# Patient Record
Sex: Male | Born: 1980 | Race: Black or African American | Hispanic: No | Marital: Married | Smoking: Current every day smoker
Health system: Southern US, Community
[De-identification: ages and names within clinical notes are randomized; demographics above are authoritative.]

## PROBLEM LIST (undated history)

## (undated) ENCOUNTER — Emergency Department (HOSPITAL_BASED_OUTPATIENT_CLINIC_OR_DEPARTMENT_OTHER): Admission: EM | Discharge status: Absent without leave | Payer: 59

---

## 2015-05-25 ENCOUNTER — Emergency Department (HOSPITAL_BASED_OUTPATIENT_CLINIC_OR_DEPARTMENT_OTHER)
Admission: EM | Admit: 2015-05-25 | Discharge: 2015-05-26 | Disposition: A | Discharge status: Home / Self Care | Payer: 59 | Attending: Emergency Medicine | Admitting: Emergency Medicine

## 2015-05-25 ENCOUNTER — Encounter (HOSPITAL_BASED_OUTPATIENT_CLINIC_OR_DEPARTMENT_OTHER): Payer: Self-pay | Admitting: Emergency Medicine

## 2015-05-25 ENCOUNTER — Emergency Department (HOSPITAL_BASED_OUTPATIENT_CLINIC_OR_DEPARTMENT_OTHER): Payer: 59

## 2015-05-25 DIAGNOSIS — J209 Acute bronchitis, unspecified: Secondary | ICD-10-CM | POA: Insufficient documentation

## 2015-05-25 DIAGNOSIS — R071 Chest pain on breathing: Secondary | ICD-10-CM | POA: Diagnosis present

## 2015-05-25 DIAGNOSIS — R509 Fever, unspecified: Secondary | ICD-10-CM

## 2015-05-25 DIAGNOSIS — Z720 Tobacco use: Secondary | ICD-10-CM | POA: Diagnosis not present

## 2015-05-25 LAB — COMPREHENSIVE METABOLIC PANEL
ALBUMIN: 4.1 g/dL (ref 3.5–5.0)
ALK PHOS: 38 U/L (ref 38–126)
ALT: 18 U/L (ref 17–63)
AST: 22 U/L (ref 15–41)
Anion gap: 6 (ref 5–15)
BUN: 11 mg/dL (ref 6–20)
CO2: 27 mmol/L (ref 22–32)
Calcium: 8.6 mg/dL — ABNORMAL LOW (ref 8.9–10.3)
Chloride: 100 mmol/L — ABNORMAL LOW (ref 101–111)
Creatinine, Ser: 1.23 mg/dL (ref 0.61–1.24)
GFR calc Af Amer: >60 mL/min (ref >60)
GFR calc non Af Amer: >60 mL/min (ref >60)
GLUCOSE: 101 mg/dL — AB (ref 65–99)
Potassium: 3.8 mmol/L (ref 3.5–5.1)
Sodium: 133 mmol/L — ABNORMAL LOW (ref 135–145)
TOTAL PROTEIN: 7.4 g/dL (ref 6.5–8.1)
Total Bilirubin: 0.7 mg/dL (ref 0.3–1.2)

## 2015-05-25 LAB — CBC WITH DIFFERENTIAL/PLATELET
BASOS ABS: 0 10*3/uL (ref 0.0–0.1)
Basophils Relative: 0 % (ref 0–1)
EOS ABS: 0 10*3/uL (ref 0.0–0.7)
EOS PCT: 0 % (ref 0–5)
HCT: 37.7 % — ABNORMAL LOW (ref 39.0–52.0)
HEMOGLOBIN: 12.7 g/dL — AB (ref 13.0–17.0)
Lymphocytes Relative: 23 % (ref 12–46)
Lymphs Abs: 1.5 10*3/uL (ref 0.7–4.0)
MCH: 30.9 pg (ref 26.0–34.0)
MCHC: 33.7 g/dL (ref 30.0–36.0)
MCV: 91.7 fL (ref 78.0–100.0)
Monocytes Absolute: 1 10*3/uL (ref 0.1–1.0)
Monocytes Relative: 15 % — ABNORMAL HIGH (ref 3–12)
NEUTROS ABS: 4 10*3/uL (ref 1.7–7.7)
Neutrophils Relative %: 62 % (ref 43–77)
PLATELETS: 143 10*3/uL — AB (ref 150–400)
RBC: 4.11 MIL/uL — ABNORMAL LOW (ref 4.22–5.81)
RDW: 11.3 % — AB (ref 11.5–15.5)
WBC: 6.5 10*3/uL (ref 4.0–10.5)

## 2015-05-25 LAB — URINALYSIS, ROUTINE W REFLEX MICROSCOPIC
Bilirubin Urine: NEGATIVE
Glucose, UA: NEGATIVE mg/dL
HGB URINE DIPSTICK: NEGATIVE
Ketones, ur: NEGATIVE mg/dL
LEUKOCYTES UA: NEGATIVE
NITRITE: NEGATIVE
PROTEIN: NEGATIVE mg/dL
SPECIFIC GRAVITY, URINE: 1.006 (ref 1.005–1.030)
Urobilinogen, UA: 0.2 mg/dL (ref 0.0–1.0)
pH: 6.5 (ref 5.0–8.0)

## 2015-05-25 LAB — TROPONIN I

## 2015-05-25 MED ORDER — ACETAMINOPHEN 500 MG PO TABS
1000.0000 mg | ORAL_TABLET | Freq: Once | ORAL | Status: AC
  Administered 2015-05-25: 1000 mg via ORAL
  Filled 2015-05-25: qty 2

## 2015-05-25 MED ORDER — AZITHROMYCIN 250 MG PO TABS: 250.0000 mg | ORAL_TABLET | Freq: Every day | ORAL | Status: AC

## 2015-05-25 MED ORDER — SODIUM CHLORIDE 0.9 % IV BOLUS (SEPSIS)
1000.0000 mL | Freq: Once | INTRAVENOUS | Status: AC
  Administered 2015-05-25: 1000 mL via INTRAVENOUS

## 2015-05-25 MED ORDER — IOHEXOL 350 MG/ML SOLN
100.0000 mL | Freq: Once | INTRAVENOUS | Status: AC | PRN
  Administered 2015-05-25: 100 mL via INTRAVENOUS

## 2015-05-25 MED ORDER — KETOROLAC TROMETHAMINE 30 MG/ML IJ SOLN
30.0000 mg | Freq: Once | INTRAMUSCULAR | Status: AC
  Administered 2015-05-25: 30 mg via INTRAVENOUS
  Filled 2015-05-25: qty 1

## 2015-05-25 MED ORDER — IBUPROFEN 800 MG PO TABS: 800.0000 mg | ORAL_TABLET | Freq: Three times a day (TID) | ORAL | Status: DC

## 2015-05-25 NOTE — Discharge Instructions (Signed)
Acute Bronchitis Bronchitis is inflammation of the airways that extend from the windpipe into the lungs (bronchi). The inflammation often causes mucus to develop. This leads to a cough, which is the most common symptom of bronchitis.  In acute bronchitis, the condition usually develops suddenly and goes away over time, usually in a couple weeks. Smoking, allergies, and asthma can make bronchitis worse. Repeated episodes of bronchitis may cause further lung problems.  CAUSES Acute bronchitis is most often caused by the same virus that causes a cold. The virus can spread from person to person (contagious) through coughing, sneezing, and touching contaminated objects. SIGNS AND SYMPTOMS   Cough.   Fever.   Coughing up mucus.   Body aches.   Chest congestion.   Chills.   Shortness of breath.   Sore throat.  DIAGNOSIS  Acute bronchitis is usually diagnosed through a physical exam. Your health care provider will also ask you questions about your medical history. Tests, such as chest X-rays, are sometimes done to rule out other conditions.  TREATMENT  Acute bronchitis usually goes away in a couple weeks. Oftentimes, no medical treatment is necessary. Medicines are sometimes given for relief of fever or cough. Antibiotic medicines are usually not needed but may be prescribed in certain situations. In some cases, an inhaler may be recommended to help reduce shortness of breath and control the cough. A cool mist vaporizer may also be used to help thin bronchial secretions and make it easier to clear the chest.  HOME CARE INSTRUCTIONS  Get plenty of rest.   Drink enough fluids to keep your urine clear or pale yellow (unless you have a medical condition that requires fluid restriction). Increasing fluids may help thin your respiratory secretions (sputum) and reduce chest congestion, and it will prevent dehydration.   Take medicines only as directed by your health care provider.  If  you were prescribed an antibiotic medicine, finish it all even if you start to feel better.  Avoid smoking and secondhand smoke. Exposure to cigarette smoke or irritating chemicals will make bronchitis worse. If you are a smoker, consider using nicotine gum or skin patches to help control withdrawal symptoms. Quitting smoking will help your lungs heal faster.   Reduce the chances of another bout of acute bronchitis by washing your hands frequently, avoiding people with cold symptoms, and trying not to touch your hands to your mouth, nose, or eyes.   Keep all follow-up visits as directed by your health care provider.  SEEK MEDICAL CARE IF: Your symptoms do not improve after 1 week of treatment.  SEEK IMMEDIATE MEDICAL CARE IF:  You develop an increased fever or chills.   You have chest pain.   You have severe shortness of breath.  You have bloody sputum.   You develop dehydration.  You faint or repeatedly feel like you are going to pass out.  You develop repeated vomiting.  You develop a severe headache. MAKE SURE YOU:   Understand these instructions.  Will watch your condition.  Will get help right away if you are not doing well or get worse. Document Released: 11/22/2004 Document Revised: 03/01/2014 Document Reviewed: 04/07/2013 [facility] Patient Information 2015 [place], [place]. This information is not intended to replace advice given to you by your health care provider. Make sure you discuss any questions you have with your health care provider.  Fever, Adult A fever is a higher than normal body temperature. In an adult, an oral temperature around 98.6 F (37 C) is  considered normal. A temperature of 100.4 F (38 C) or higher is generally considered a fever. Mild or moderate fevers generally have no long-term effects and often do not require treatment. Extreme fever (greater than or equal to 106 F or 41.1 C) can cause seizures. The sweating that may occur with  repeated or prolonged fever may cause dehydration. Elderly people can develop confusion during a fever. A measured temperature can vary with:  Age.  Time of day.  Method of measurement (mouth, underarm, rectal, or ear). The fever is confirmed by taking a temperature with a thermometer. Temperatures can be taken different ways. Some methods are accurate and some are not.  An oral temperature is used most commonly. Electronic thermometers are fast and accurate.  An ear temperature will only be accurate if the thermometer is positioned as recommended by the manufacturer.  A rectal temperature is accurate and done for those adults who have a condition where an oral temperature cannot be taken.  An underarm (axillary) temperature is not accurate and not recommended. Fever is a symptom, not a disease.  CAUSES   Infections commonly cause fever.  Some noninfectious causes for fever include:  Some arthritis conditions.  Some thyroid or adrenal gland conditions.  Some immune system conditions.  Some types of cancer.  A medicine reaction.  High doses of certain street drugs such as methamphetamine.  Dehydration.  Exposure to high outside or room temperatures.  Occasionally, the source of a fever cannot be determined. This is sometimes called a "fever of unknown origin" (FUO).  Some situations may lead to a temporary rise in body temperature that may go away on its own. Examples are:  Childbirth.  Surgery.  Intense exercise. HOME CARE INSTRUCTIONS   Take appropriate medicines for fever. Follow dosing instructions carefully. If you use acetaminophen to reduce the fever, be careful to avoid taking other medicines that also contain acetaminophen. Do not take aspirin for a fever if you are younger than age 80. There is an association with Reye's syndrome. Reye's syndrome is a rare but potentially deadly disease.  If an infection is present and antibiotics have been prescribed,  take them as directed. Finish them even if you start to feel better.  Rest as needed.  Maintain an adequate fluid intake. To prevent dehydration during an illness with prolonged or recurrent fever, you may need to drink extra fluid.Drink enough fluids to keep your urine clear or pale yellow.  Sponging or bathing with room temperature water may help reduce body temperature. Do not use ice water or alcohol sponge baths.  Dress comfortably, but do not over-bundle. SEEK MEDICAL CARE IF:   You are unable to keep fluids down.  You develop vomiting or diarrhea.  You are not feeling at least partly better after 3 days.  You develop new symptoms or problems. SEEK IMMEDIATE MEDICAL CARE IF:   You have shortness of breath or trouble breathing.  You develop excessive weakness.  You are dizzy or you faint.  You are extremely thirsty or you are making little or no urine.  You develop new pain that was not there before (such as in the head, neck, chest, back, or abdomen).  You have persistent vomiting and diarrhea for more than 1 to 2 days.  You develop a stiff neck or your eyes become sensitive to light.  You develop a skin rash.  You have a fever or persistent symptoms for more than 2 to 3 days.  You have a fever  and your symptoms suddenly get worse. MAKE SURE YOU:   Understand these instructions.  Will watch your condition.  Will get help right away if you are not doing well or get worse. Document Released: 04/10/2001 Document Revised: 03/01/2014 Document Reviewed: 08/16/2011 [facility] Patient Information 2015 [place], [place]. This information is not intended to replace advice given to you by your health care provider. Make sure you discuss any questions you have with your health care provider.

## 2015-05-25 NOTE — ED Provider Notes (Signed)
CSN: [id number]     Arrival date & time 05/25/15  2009 History  This chart was scribed for Dustin Fossa, MD by [name], ED Scribe. This patient was seen in room MH02/MH02 and the patient's care was started 8:26 PM.   Chief Complaint  Patient presents with  . Pain with deep breath    The history is provided by the patient. No language interpreter was used.   HPI Comments: Dustin Kramer is a 34 y.o. male who presents to the Emergency Department complaining of intermittent, sharp right-sided rib cage pain that is only present with deep breathing or coughing onset 3 days ago. Pt reports an associated intermittent, non-productive cough and fever that appeared 3 hours ago. Pt has a current temp of 101.93F.  He notes he works around a large number of people but is unsure of sick contacts. Pt is a current tobacco smoker and socially consumes EtOH but denies any recent episodes of alcohol related emesis. Denies any attributable injury, nausea, vomiting, sore throat, otalgia, abdominal pain, or back pain. Additionally denies IV drug abuse, or any pertinent PMhx of PFhx.   History reviewed. No pertinent past medical history.  History reviewed. No pertinent past surgical history. No family history on file. History  Substance Use Topics  . Smoking status: Current Every Day Smoker -- 1.00 packs/day  . Smokeless tobacco: Not on file  . Alcohol Use: Yes     Comment: weekends    Review of Systems  Constitutional: Positive for fever.  HENT: Negative for ear pain and sore throat.   Respiratory: Positive for cough.   Cardiovascular: Positive for chest pain. Negative for leg swelling.  Gastrointestinal: Negative for nausea, vomiting and abdominal pain.  Musculoskeletal: Positive for arthralgias. Negative for back pain.  All other systems reviewed and are negative.   Allergies  Review of patient's allergies indicates no known allergies.  Home Medications   Prior to Admission medications    Not on File   BP 116/47 mmHg  Pulse 75  Temp(Src) 101.6 F (38.7 C)  Resp 20  Ht  (1.727 m)  Wt 156 lb (70.761 kg)  BMI 23.73 kg/m2  SpO2 99% Physical Exam  Constitutional: He is oriented to person, place, and time. He appears well-developed and well-nourished.  HENT:  Head: Normocephalic and atraumatic.  Right Ear: External ear normal.  Left Ear: External ear normal.  Eyes: EOM are normal. Pupils are equal, round, and reactive to light.  Cardiovascular: Normal rate and regular rhythm.   No murmur heard. Pulmonary/Chest: Effort normal and breath sounds normal. No respiratory distress. He exhibits no tenderness.  Abdominal: Soft. There is no tenderness. There is no rebound and no guarding.  Musculoskeletal: He exhibits no edema or tenderness.  Neurological: He is alert and oriented to person, place, and time. No cranial nerve deficit. Coordination normal.  Skin: Skin is warm and dry.  Psychiatric: He has a normal mood and affect. His behavior is normal.  Nursing note and vitals reviewed.   ED Course  Procedures  DIAGNOSTIC STUDIES: Oxygen Saturation is 99% on RA, normal by my interpretation.    COORDINATION OF CARE: 8:32 PM Discussed treatment plan which includes to order Chest Xray with pt. Pt acknowledges and agrees to plan.   Labs Review Labs Reviewed  COMPREHENSIVE METABOLIC PANEL - Abnormal; Notable for the following:    Sodium 133 (*)    Chloride 100 (*)    Glucose, Bld 101 (*)    Calcium 8.6 (*)  All other components within normal limits  CBC WITH DIFFERENTIAL/PLATELET - Abnormal; Notable for the following:    RBC 4.11 (*)    Hemoglobin 12.7 (*)    HCT 37.7 (*)    RDW 11.3 (*)    Platelets 143 (*)    Monocytes Relative 15 (*)    All other components within normal limits  URINALYSIS, ROUTINE W REFLEX MICROSCOPIC (NOT AT [facility])  TROPONIN I    Imaging Review Dg Chest 2 View  05/25/2015   CLINICAL DATA:  Chest pain for 3 days with shortness of  breath  EXAM: CHEST  2 VIEW  COMPARISON:  None.  FINDINGS: Lungs are clear. Heart size and pulmonary vascularity are normal. No adenopathy. No pneumothorax. No bone lesions.  IMPRESSION: No abnormality noted.   Electronically Signed   By: Bretta Bang III M.D.   On: 05/25/2015 20:43   Ct Angio Chest Pe W/cm &/or Wo Cm  05/25/2015   CLINICAL DATA:  RIGHT chest pain with deep inspiration or cough for 3 days, fever.  EXAM: CT ANGIOGRAPHY CHEST WITH CONTRAST  TECHNIQUE: Multidetector CT imaging of the chest was performed using the standard protocol during bolus administration of intravenous contrast. Multiplanar CT image reconstructions and MIPs were obtained to evaluate the vascular anatomy.  CONTRAST:  OMNIPAQUE IOHEXOL 350 MG/ML SOLN  COMPARISON:  Chest radiograph May 25, 2015 at 2034 hours  FINDINGS: PULMONARY ARTERY: Adequate contrast opacification of the pulmonary artery's. Main pulmonary artery is not enlarged. No pulmonary arterial filling defects to the level of the subsegmental branches.  MEDIASTINUM: Heart and pericardium are unremarkable, no right heart strain. Thoracic aorta is normal course and caliber, unremarkable. No lymphadenopathy by CT size criteria.  LUNGS: Tracheobronchial tree is patent, no pneumothorax. Diffuse mild central bronchial wall thickening. No pleural effusions, focal consolidations, pulmonary nodules or masses.  SOFT TISSUES AND OSSEOUS STRUCTURES: Included view of the abdomen is unremarkable. Visualized soft tissues and included osseous structures appear normal.  Review of the MIP images confirms the above findings.  IMPRESSION: No acute pulmonary embolism.  Central bronchial wall thickening can be seen with reactive airway disease or bronchitis. No focal consolidation.   Electronically Signed   By: Awilda Metro M.D.   On: 05/25/2015 22:30     EKG Interpretation   Date/Time:  Wednesday May 25 2015 20:59:04 EDT Ventricular Rate:  70 PR Interval:  146 QRS  Duration: 80 QT Interval:  382 QTC Calculation: 412 R Axis:   -64 Text Interpretation:  Sinus rhythm with occasional Premature ventricular  complexes Left axis deviation Low voltage QRS T wave abnormality, consider  anterior ischemia Abnormal ECG Confirmed by Lincoln Brigham [phone]) on 05/25/2015  9:03:15 PM      MDM   Final diagnoses:  Acute bronchitis, unspecified organism  Febrile illness, acute    Pt here with pleuritis chest pain for three days, fever today.  Pt without respiratory distress in department.  No evidence of pna on CXR.  Given pleuritic nature of pain, no clear cause CT PE study ordered, which was negative for PE but c/w acute bronchitis.  Treating with zpack given severity of sxs.  Hx and presentation not c/w ACS, dissection, cholecystitis.  Discussed with pt home care, return precautions.    I personally performed the services described in this documentation, which was scribed in my presence. The recorded information has been reviewed and is accurate.   Dustin Fossa, MD 05/25/15 [phone]

## 2015-05-25 NOTE — ED Notes (Signed)
Pain in right rib area with deep breath x3 days.  Denies cough. Denies injury

## 2016-03-23 ENCOUNTER — Emergency Department (HOSPITAL_BASED_OUTPATIENT_CLINIC_OR_DEPARTMENT_OTHER)
Admission: EM | Admit: 2016-03-23 | Discharge: 2016-03-23 | Disposition: A | Discharge status: Home / Self Care | Payer: 59 | Attending: Emergency Medicine | Admitting: Emergency Medicine

## 2016-03-23 ENCOUNTER — Encounter (HOSPITAL_BASED_OUTPATIENT_CLINIC_OR_DEPARTMENT_OTHER): Payer: Self-pay | Admitting: Emergency Medicine

## 2016-03-23 DIAGNOSIS — K0889 Other specified disorders of teeth and supporting structures: Secondary | ICD-10-CM

## 2016-03-23 DIAGNOSIS — Z791 Long term (current) use of non-steroidal anti-inflammatories (NSAID): Secondary | ICD-10-CM | POA: Diagnosis not present

## 2016-03-23 DIAGNOSIS — K029 Dental caries, unspecified: Secondary | ICD-10-CM | POA: Diagnosis not present

## 2016-03-23 DIAGNOSIS — F17200 Nicotine dependence, unspecified, uncomplicated: Secondary | ICD-10-CM | POA: Diagnosis not present

## 2016-03-23 MED ORDER — IBUPROFEN 800 MG PO TABS
800.0000 mg | ORAL_TABLET | Freq: Three times a day (TID) | ORAL | Status: AC | PRN
Start: 2016-03-23 — End: ?

## 2016-03-23 MED ORDER — PENICILLIN V POTASSIUM 500 MG PO TABS: 500.0000 mg | ORAL_TABLET | Freq: Four times a day (QID) | ORAL | Status: AC

## 2016-03-23 NOTE — ED Provider Notes (Signed)
CSN: [id number]     Arrival date & time 03/23/16  0912 History   First MD Initiated Contact with Patient 03/23/16 [phone]     Chief Complaint  Patient presents with  . Dental Pain     (Consider location/radiation/quality/duration/timing/severity/associated sxs/prior Treatment) HPI   Patient presents with right lower molar pain that has gradually worsened over the past few days.  States he has a "chipped tooth" in the area.  Has taken 200-400mg  ibuprofen without improvement.  Denies fevers, facial swelling, sore throat, difficulty swallowing or breathing.  Has a local dentist.    History reviewed. No pertinent past medical history. History reviewed. No pertinent past surgical history. History reviewed. No pertinent family history. Social History  Substance Use Topics  . Smoking status: Current Every Day Smoker -- 1.00 packs/day  . Smokeless tobacco: None  . Alcohol Use: Yes     Comment: weekends    Review of Systems  Constitutional: Negative for fever and chills.  HENT: Positive for dental problem. Negative for congestion, rhinorrhea, sinus pressure, sore throat, trouble swallowing and voice change.   Respiratory: Negative for shortness of breath, wheezing and stridor.   Allergic/Immunologic: Negative for immunocompromised state.  Psychiatric/Behavioral: Negative for self-injury.      Allergies  Review of patient's allergies indicates no known allergies.  Home Medications   Prior to Admission medications   Medication Sig Start Date End Date Taking? Authorizing Provider  ibuprofen (ADVIL,MOTRIN) 800 MG tablet Take 1 tablet (800 mg total) by mouth 3 (three) times daily. 05/25/15  Yes Tilden FossaElizabeth Rees, MD  azithromycin (ZITHROMAX) 250 MG tablet Take 1 tablet (250 mg total) by mouth daily. Take first 2 tablets together, then 1 every day until finished. 05/25/15   Tilden FossaElizabeth Rees, MD   BP 130/88 mmHg  Pulse 69  Temp(Src) 98.7 F (37.1 C) (Oral)  Resp 16  Ht 5\' 8"  (1.727 m)  Wt  72.122 kg  BMI 24.18 kg/m2  SpO2 99% Physical Exam  Constitutional: He appears well-developed and well-nourished. No distress.  HENT:  Head: Normocephalic and atraumatic.  Mouth/Throat: Uvula is midline and oropharynx is clear and moist. Mucous membranes are not dry. No uvula swelling. No oropharyngeal exudate, posterior oropharyngeal edema, posterior oropharyngeal erythema or tonsillar abscesses.  Several teeth with severe decay.  Right lower second molar with severe decay to the level of the gingiva with small amount of blood over the posterior edge, tender to palpation.  No swelling.    Neck: Normal range of motion. Neck supple.  Cardiovascular: Normal rate.   Pulmonary/Chest: Effort normal and breath sounds normal. No stridor.  Lymphadenopathy:    He has no cervical adenopathy.  Neurological: He is alert.  Skin: He is not diaphoretic.  Nursing note and vitals reviewed.   ED Course  Procedures (including critical care time) Labs Review Labs Reviewed - No data to display  Imaging Review No results found. I have personally reviewed and evaluated these images and lab results as part of my medical decision-making.   EKG Interpretation None      MDM   Final diagnoses:  Pain, dental  Dental caries    Afebrile, nontoxic patient with new dental pain.  No obvious abscess.  Tenderness around right lower molar, possible early infection.  Doubt deep space head or neck infection.  Doubt Ludwig's angina. No airway concerns.   D/C home with antibiotic, pain medication and dental follow up.  Discussed findings, treatment, and follow up  with patient.  Pt given return precautions.  Pt verbalizes understanding and agrees with plan.        Trixie Dredge, PA-C 03/23/16 [id number]  Doug Sou, MD 03/23/16 [phone]

## 2016-03-23 NOTE — ED Notes (Signed)
Pt with left lower tooth pain

## 2016-03-23 NOTE — Discharge Instructions (Signed)
Read the information below.  Use the prescribed medication as directed.  Please discuss all new medications with your pharmacist.  You may take tylenol in addition to the prescribed medication. You may return to the Emergency Department at any time for worsening condition or any new symptoms that concern you.   Please call the dentist listed above or your dentist to schedule a close follow up appointment.  If you develop fevers, swelling in your face, difficulty swallowing or breathing, return to the ER immediately for a recheck.    Dental Pain Dental pain may be caused by many things, including:  Tooth decay (cavities or caries). Cavities expose the nerve of your tooth to air and hot or cold temperatures. This can cause pain or discomfort.  Abscess or infection. A dental abscess is a collection of infected pus from a bacterial infection in the inner part of the tooth (pulp). It usually occurs at the end of the tooth's root.  Injury.  An unknown reason (idiopathic). Your pain may be mild or severe. It may only occur when:  You are chewing.  You are exposed to hot or cold temperature.  You are eating or drinking sugary foods or beverages, such as soda or candy. Your pain may also be constant. HOME CARE INSTRUCTIONS Watch your dental pain for any changes. The following actions may help to lessen any discomfort that you are feeling:  Take medicines only as directed by your dentist.  If you were prescribed an antibiotic medicine, finish all of it even if you start to feel better.  Keep all follow-up visits as directed by your dentist. This is important.  Do not apply heat to the outside of your face.  Rinse your mouth or gargle with salt water if directed by your dentist. This helps with pain and swelling.  You can make salt water by adding  tsp of salt to 1 cup of warm water.  Apply ice to the painful area of your face:  Put ice in a plastic bag.  Place a towel between your skin  and the bag.  Leave the ice on for 20 minutes, 2-3 times per day.  Avoid foods or drinks that cause you pain, such as:  Very hot or very cold foods or drinks.  Sweet or sugary foods or drinks. SEEK MEDICAL CARE IF:  Your pain is not controlled with medicines.  Your symptoms are worse.  You have new symptoms. SEEK IMMEDIATE MEDICAL CARE IF:  You are unable to open your mouth.  You are having trouble breathing or swallowing.  You have a fever.  Your face, neck, or jaw is swollen.   This information is not intended to replace advice given to you by your health care provider. Make sure you discuss any questions you have with your health care provider.   Document Released: 10/15/2005 Document Revised: 03/01/2015 Document Reviewed: 10/11/2014 Elsevier Interactive Patient Education 2016 [employer]  Dental Caries Dental caries (also called tooth decay) is the most common oral disease. It can occur at any age but is more common in children and young adults.  HOW DENTAL CARIES DEVELOPS  The process of decay begins when bacteria and foods (particularly sugars and starches) combine in your mouth to produce plaque. Plaque is a substance that sticks to the hard, outer surface of a tooth (enamel). The bacteria in plaque produce acids that attack enamel. These acids may also attack the root surface of a tooth (cementum) if it is exposed. Repeated  attacks dissolve these surfaces and create holes in the tooth (cavities). If left untreated, the acids destroy the other layers of the tooth.  RISK FACTORS  Frequent sipping of sugary beverages.   Frequent snacking on sugary and starchy foods, especially those that easily get stuck in the teeth.   Poor oral hygiene.   Dry mouth.   Substance abuse such as methamphetamine abuse.   Broken or poor-fitting dental restorations.   Eating disorders.   Gastroesophageal reflux disease (GERD).   Certain radiation treatments to the head  and neck. SYMPTOMS In the early stages of dental caries, symptoms are seldom present. Sometimes white, chalky areas may be seen on the enamel or other tooth layers. In later stages, symptoms may include:  Pits and holes on the enamel.  Toothache after sweet, hot, or cold foods or drinks are consumed.  Pain around the tooth.  Swelling around the tooth. DIAGNOSIS  Most of the time, dental caries is detected during a regular dental checkup. A diagnosis is made after a thorough medical and dental history is taken and the surfaces of your teeth are checked for signs of dental caries. Sometimes special instruments, such as lasers, are used to check for dental caries. Dental X-ray exams may be taken so that areas not visible to the eye (such as between the contact areas of the teeth) can be checked for cavities.  TREATMENT  If dental caries is in its early stages, it may be reversed with a fluoride treatment or an application of a remineralizing agent at the dental office. Thorough brushing and flossing at home is needed to aid these treatments. If it is in its later stages, treatment depends on the location and extent of tooth destruction:   If a small area of the tooth has been destroyed, the destroyed area will be removed and cavities will be filled with a material such as gold, silver amalgam, or composite resin.   If a large area of the tooth has been destroyed, the destroyed area will be removed and a cap (crown) will be fitted over the remaining tooth structure.   If the center part of the tooth (pulp) is affected, a procedure called a root canal will be needed before a filling or crown can be placed.   If most of the tooth has been destroyed, the tooth may need to be pulled (extracted). HOME CARE INSTRUCTIONS You can prevent, stop, or reverse dental caries at home by practicing good oral hygiene. Good oral hygiene includes:  Thoroughly cleaning your teeth at least twice a day with a  toothbrush and dental floss.   Using a fluoride toothpaste. A fluoride mouth rinse may also be used if recommended by your dentist or health care provider.   Restricting the amount of sugary and starchy foods and sugary liquids you consume.   Avoiding frequent snacking on these foods and sipping of these liquids.   Keeping regular visits with a dentist for checkups and cleanings. PREVENTION   Practice good oral hygiene.  Consider a dental sealant. A dental sealant is a coating material that is applied by your dentist to the pits and grooves of teeth. The sealant prevents food from being trapped in them. It may protect the teeth for several years.  Ask about fluoride supplements if you live in a community without fluorinated water or with water that has a low fluoride content. Use fluoride supplements as directed by your dentist or health care provider.  Allow fluoride varnish applications to  teeth if directed by your dentist or health care provider.   This information is not intended to replace advice given to you by your health care provider. Make sure you discuss any questions you have with your health care provider.   Document Released: 07/07/2002 Document Revised: 11/05/2014 Document Reviewed: 10/17/2012 Elsevier Interactive Patient Education 2016 [employer]  Dental Care and Dentist Visits Dental care supports good overall health. Regular dental visits can also help you avoid dental pain, bleeding, infection, and other more serious health problems in the future. It is important to keep the mouth healthy because diseases in the teeth, gums, and other oral tissues can spread to other areas of the body. Some problems, such as diabetes, heart disease, and pre-term labor have been associated with poor oral health.  See your dentist every 6 months. If you experience emergency problems such as a toothache or broken tooth, go to the dentist right away. If you see your dentist regularly,  you may catch problems early. It is easier to be treated for problems in the early stages.  WHAT TO EXPECT AT A DENTIST VISIT  Your dentist will look for many common oral health problems and recommend proper treatment. At your regular dental visit, you can expect:  Gentle cleaning of the teeth and gums. This includes scraping and polishing. This helps to remove the sticky substance around the teeth and gums (plaque). Plaque forms in the mouth shortly after eating. Over time, plaque hardens on the teeth as tartar. If tartar is not removed regularly, it can cause problems. Cleaning also helps remove stains.  Periodic X-rays. These pictures of the teeth and supporting bone will help your dentist assess the health of your teeth.  Periodic fluoride treatments. Fluoride is a natural mineral shown to help strengthen teeth. Fluoride treatmentinvolves applying a fluoride gel or varnish to the teeth. It is most commonly done in children.  Examination of the mouth, tongue, jaws, teeth, and gums to look for any oral health problems, such as:  Cavities (dental caries). This is decay on the tooth caused by plaque, sugar, and acid in the mouth. It is best to catch a cavity when it is small.  Inflammation of the gums caused by plaque buildup (gingivitis).  Problems with the mouth or malformed or misaligned teeth.  Oral cancer or other diseases of the soft tissues or jaws. KEEP YOUR TEETH AND GUMS HEALTHY For healthy teeth and gums, follow these general guidelines as well as your dentist's specific advice:  Have your teeth professionally cleaned at the dentist every 6 months.  Brush twice daily with a fluoride toothpaste.  Floss your teeth daily.  Ask your dentist if you need fluoride supplements, treatments, or fluoride toothpaste.  Eat a healthy diet. Reduce foods and drinks with added sugar.  Avoid smoking. TREATMENT FOR ORAL HEALTH PROBLEMS If you have oral health problems, treatment varies  depending on the conditions present in your teeth and gums.  Your caregiver will most likely recommend good oral hygiene at each visit.  For cavities, gingivitis, or other oral health disease, your caregiver will perform a procedure to treat the problem. This is typically done at a separate appointment. Sometimes your caregiver will refer you to another dental specialist for specific tooth problems or for surgery. SEEK IMMEDIATE DENTAL CARE IF:  You have pain, bleeding, or soreness in the gum, tooth, jaw, or mouth area.  A permanent tooth becomes loose or separated from the gum socket.  You experience a blow  or injury to the mouth or jaw area.   This information is not intended to replace advice given to you by your health care provider. Make sure you discuss any questions you have with your health care provider.   Document Released: 06/27/2011 Document Revised: 01/07/2012 Document Reviewed: 06/27/2011 Elsevier Interactive Patient Education [employer]

## 2016-03-23 NOTE — ED Notes (Signed)
Pt made aware to return if symptoms worsen or if any life threatening symptoms occur.   

## 2016-07-08 IMAGING — CT CT ANGIO CHEST
1 of 2 series · 19 of 32 positions shown · IV contrast (APPLIED)
Comparison: Chest radiograph May 25, 2015 at 2950 hours

CLINICAL DATA: RIGHT chest pain with deep inspiration or cough for
3 days, fever.

EXAM:
CT ANGIOGRAPHY CHEST WITH CONTRAST
TECHNIQUE: Multidetector CT imaging of the chest was performed using the
standard protocol during bolus administration of intravenous
contrast. Multiplanar CT image reconstructions and MIPs were
obtained to evaluate the vascular anatomy.
CONTRAST:  100mL OMNIPAQUE IOHEXOL 350 MG/ML SOLN

[Series 5: pe 1.0 b26f · axial · 0.64mm/px · z∈[-15,+224]mm · 19 of 267 slices shown]
[im 14/267  lung]
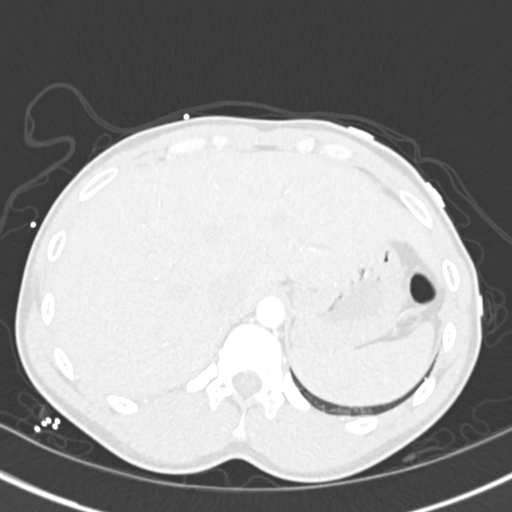
[im 27/267  mediastinal]
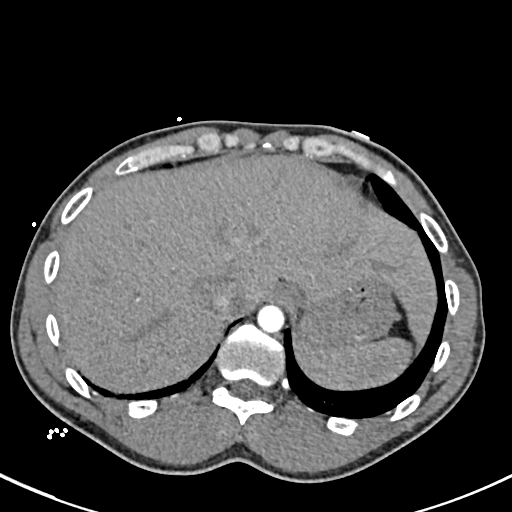
[im 40/267  lung]
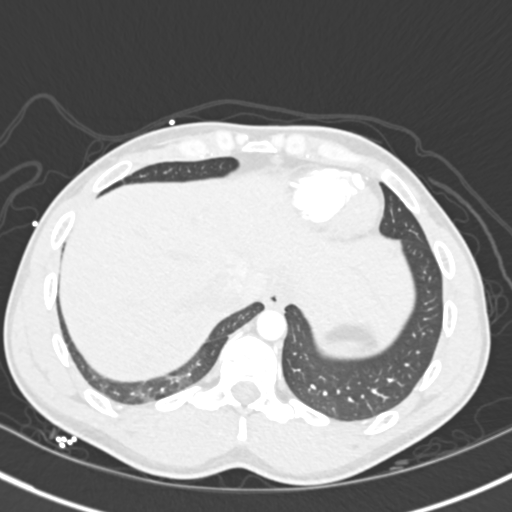
[im 67/267  mediastinal]
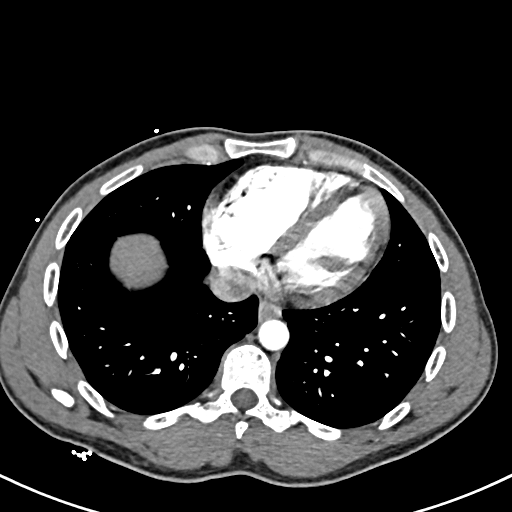
[im 80/267  lung]
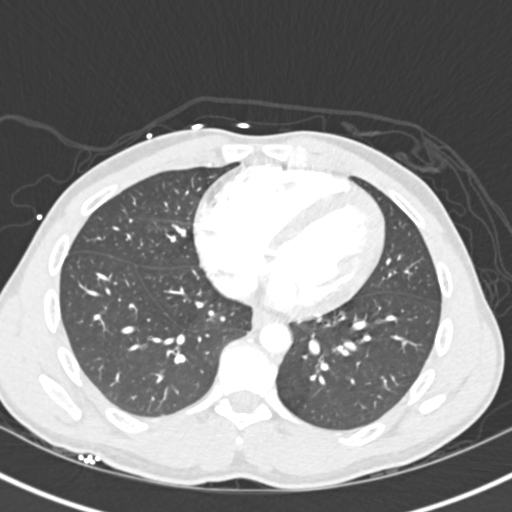
[im 89/267  mediastinal]
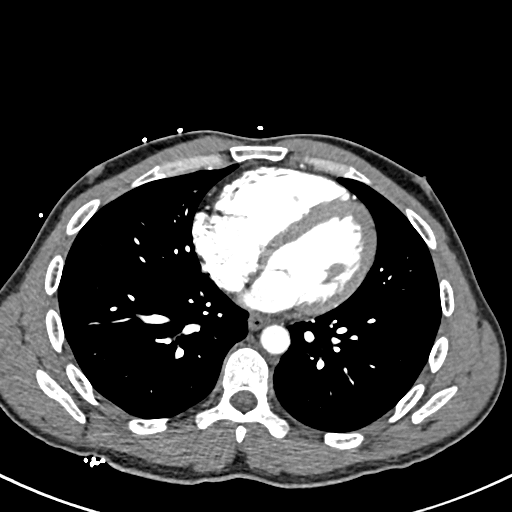
[im 94/267  lung]
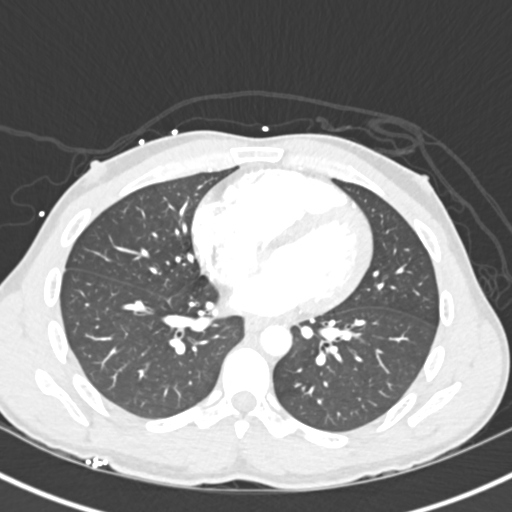
[im 107/267  mediastinal]
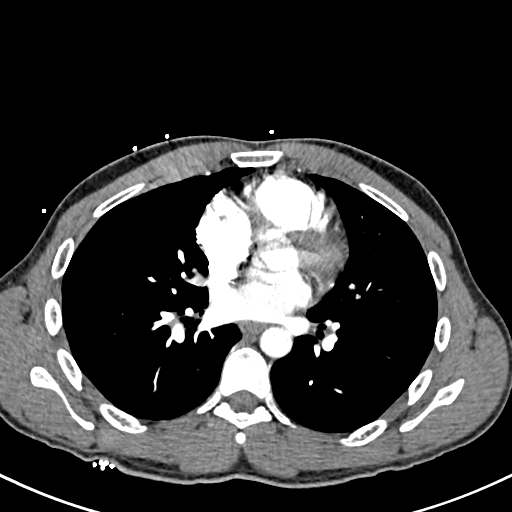
[im 120/267  lung]
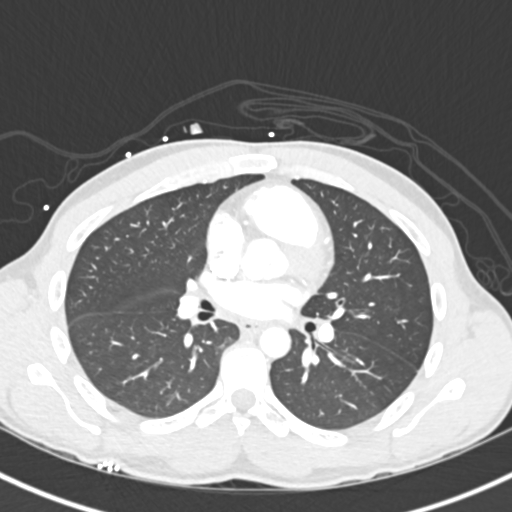
[im 134/267  mediastinal]
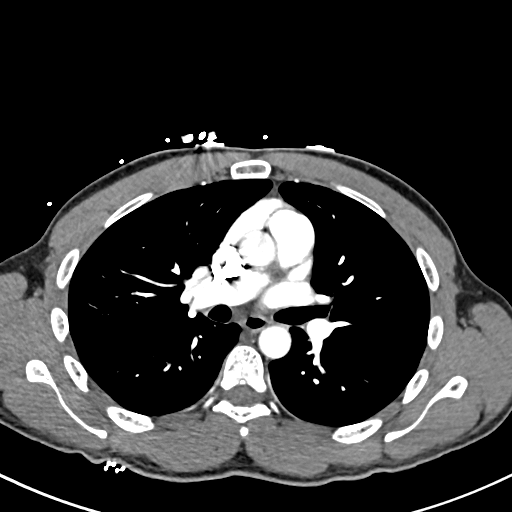
[im 147/267  lung]
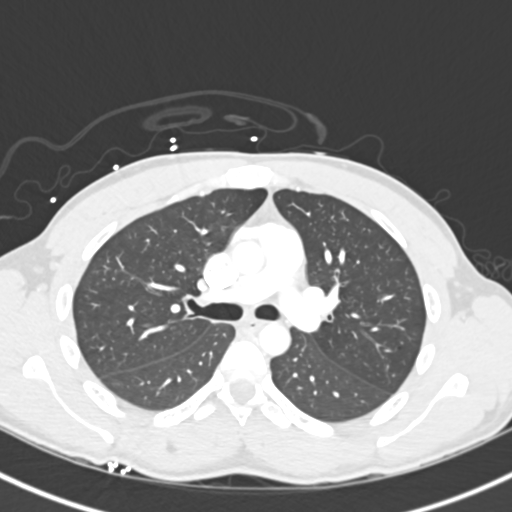
[im 160/267  mediastinal]
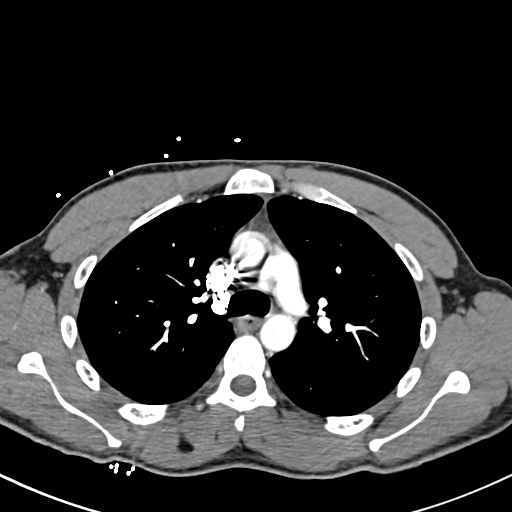
[im 173/267  lung]
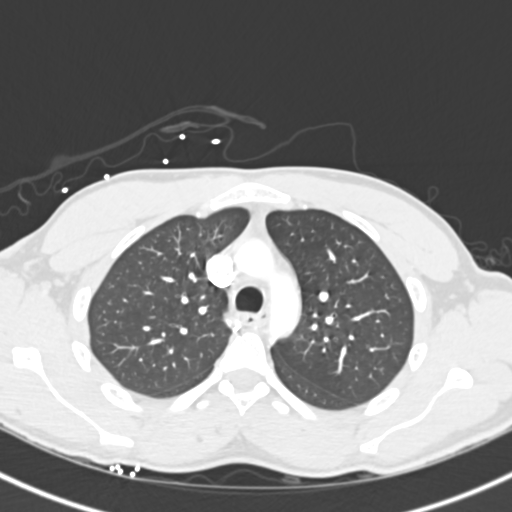
[im 178/267  mediastinal]
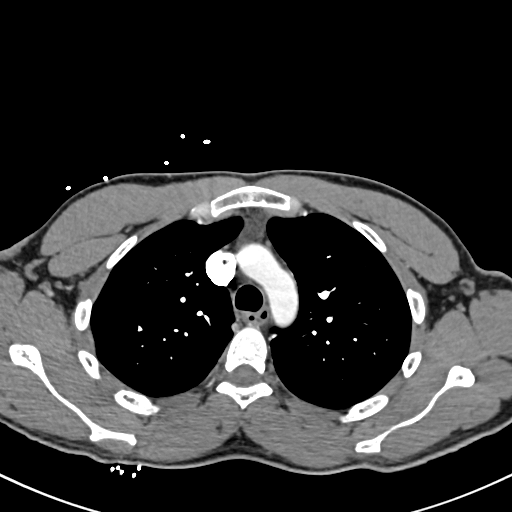
[im 187/267  lung]
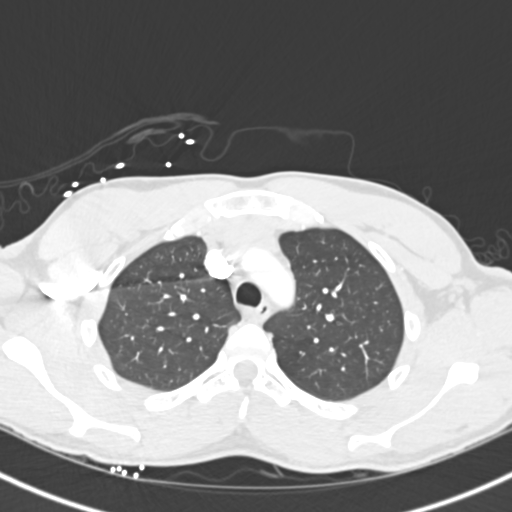
[im 200/267  mediastinal]
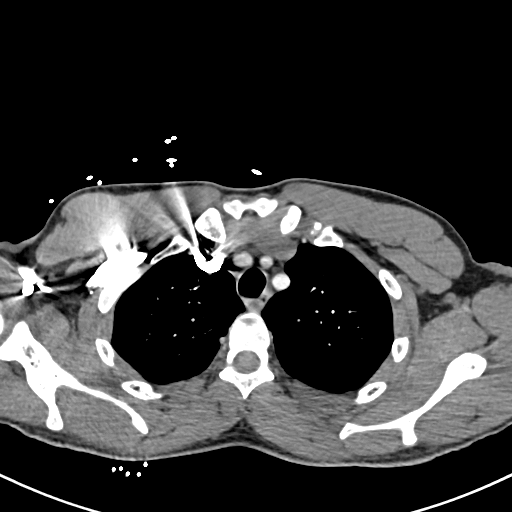
[im 227/267  lung]
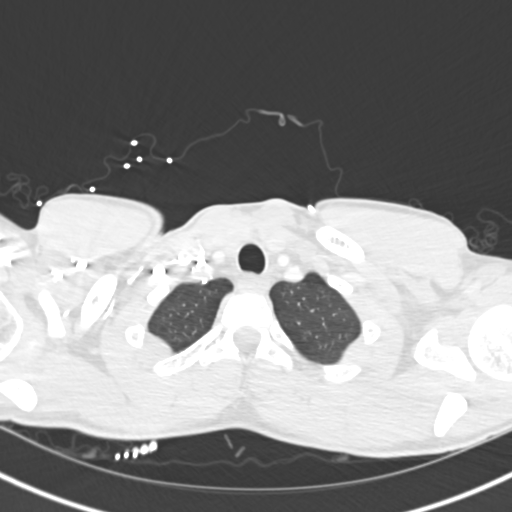
[im 240/267  mediastinal]
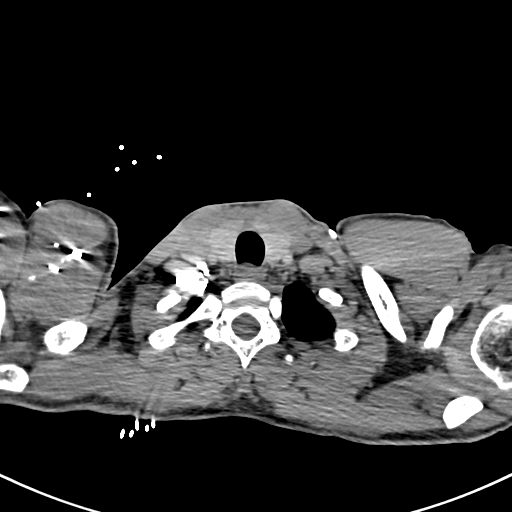
[im 253/267  lung]
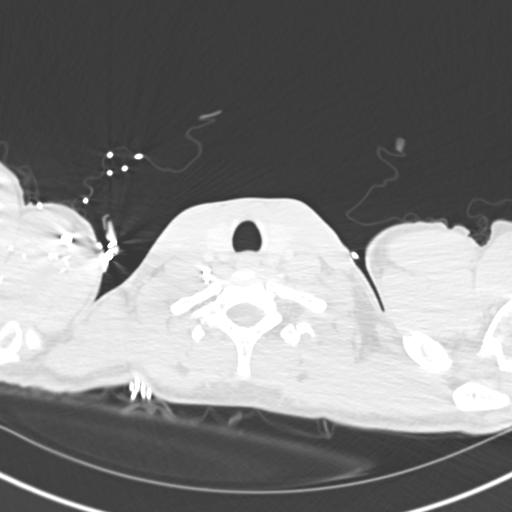

[19 of 32 positions shown; findings below may reference images not displayed]

FINDINGS: PULMONARY ARTERY: Adequate contrast opacification of the pulmonary
artery's. Main pulmonary artery is not enlarged. No pulmonary
arterial filling defects to the level of the subsegmental branches.

MEDIASTINUM: Heart and pericardium are unremarkable, no right heart
strain. Thoracic aorta is normal course and caliber, unremarkable.
No lymphadenopathy by CT size criteria.

LUNGS: Tracheobronchial tree is patent, no pneumothorax. Diffuse
mild central bronchial wall thickening. No pleural effusions, focal
consolidations, pulmonary nodules or masses.

SOFT TISSUES AND OSSEOUS STRUCTURES: Included view of the abdomen is
unremarkable. Visualized soft tissues and included osseous
structures appear normal.

Review of the MIP images confirms the above findings.
IMPRESSION: No acute pulmonary embolism.

Central bronchial wall thickening can be seen with reactive airway
disease or bronchitis. No focal consolidation.

## 2022-02-08 ENCOUNTER — Emergency Department (HOSPITAL_BASED_OUTPATIENT_CLINIC_OR_DEPARTMENT_OTHER)
Admission: EM | Admit: 2022-02-08 | Discharge: 2022-02-08 | Disposition: A | Discharge status: Home / Self Care | Payer: 59 | Attending: Emergency Medicine | Admitting: Emergency Medicine

## 2022-02-08 ENCOUNTER — Other Ambulatory Visit: Payer: Self-pay

## 2022-02-08 ENCOUNTER — Encounter (HOSPITAL_BASED_OUTPATIENT_CLINIC_OR_DEPARTMENT_OTHER): Payer: Self-pay | Admitting: Emergency Medicine

## 2022-02-08 ENCOUNTER — Emergency Department (HOSPITAL_BASED_OUTPATIENT_CLINIC_OR_DEPARTMENT_OTHER): Payer: 59

## 2022-02-08 DIAGNOSIS — Y92410 Unspecified street and highway as the place of occurrence of the external cause: Secondary | ICD-10-CM | POA: Insufficient documentation

## 2022-02-08 DIAGNOSIS — S8992XA Unspecified injury of left lower leg, initial encounter: Secondary | ICD-10-CM | POA: Diagnosis present

## 2022-02-08 DIAGNOSIS — S80812A Abrasion, left lower leg, initial encounter: Secondary | ICD-10-CM | POA: Insufficient documentation

## 2022-02-08 DIAGNOSIS — M79662 Pain in left lower leg: Secondary | ICD-10-CM

## 2022-02-08 MED ORDER — IBUPROFEN 800 MG PO TABS
800.0000 mg | ORAL_TABLET | Freq: Once | ORAL | Status: AC
  Administered 2022-02-08: 800 mg via ORAL
  Filled 2022-02-08: qty 1
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
## 2022-02-08 NOTE — ED Notes (Signed)
Requesting assistance with pain control and possible splint/ ankle support application ?
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
## 2022-02-08 NOTE — Discharge Instructions (Signed)
Recommend supportive treatment. You can use rest, ice, compression, elevation. Warm compresses may help over the swelling area. Recommend antiinflammatories such as ibuprofen for the pain.  ?If continued symptoms, recommend sports medicine follow up appt. You can call to schedule this appointment. ?
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
